# Patient Record
Sex: Male | Born: 1985 | Race: White | Hispanic: No | Marital: Single | Smoking: Former smoker
Health system: Southern US, Community
[De-identification: ages and names within clinical notes are randomized; demographics above are authoritative.]

---

## 2009-04-20 ENCOUNTER — Emergency Department (HOSPITAL_COMMUNITY): Admission: EM | Admit: 2009-04-20 | Discharge: 2009-04-21 | Payer: Self-pay | Admitting: Emergency Medicine

## 2009-05-07 ENCOUNTER — Emergency Department (HOSPITAL_COMMUNITY): Admission: EM | Admit: 2009-05-07 | Discharge: 2009-05-07 | Payer: Self-pay | Admitting: Emergency Medicine

## 2010-05-16 LAB — URINALYSIS, ROUTINE W REFLEX MICROSCOPIC
Bilirubin Urine: NEGATIVE
Glucose, UA: NEGATIVE mg/dL
Hgb urine dipstick: NEGATIVE
Protein, ur: NEGATIVE mg/dL
Specific Gravity, Urine: 1.004 — ABNORMAL LOW (ref 1.005–1.030)

## 2010-05-16 LAB — CBC
MCHC: 34.6 g/dL (ref 30.0–36.0)
Platelets: 260 10*3/uL (ref 150–400)
RBC: 4.87 MIL/uL (ref 4.22–5.81)
RDW: 12.2 % (ref 11.5–15.5)
RDW: 12.2 % (ref 11.5–15.5)
WBC: 14.9 10*3/uL — ABNORMAL HIGH (ref 4.0–10.5)

## 2010-05-16 LAB — COMPREHENSIVE METABOLIC PANEL
ALT: 32 U/L (ref 0–53)
AST: 23 U/L (ref 0–37)
Albumin: 4.3 g/dL (ref 3.5–5.2)
Alkaline Phosphatase: 71 U/L (ref 39–117)
Calcium: 9.2 mg/dL (ref 8.4–10.5)
Chloride: 102 mEq/L (ref 96–112)
Creatinine, Ser: 0.99 mg/dL (ref 0.4–1.5)
Glucose, Bld: 104 mg/dL — ABNORMAL HIGH (ref 70–99)
Total Bilirubin: 0.6 mg/dL (ref 0.3–1.2)
Total Protein: 8.1 g/dL (ref 6.0–8.3)

## 2010-05-16 LAB — LIPASE, BLOOD: Lipase: 25 U/L (ref 11–59)

## 2010-05-16 LAB — DIFFERENTIAL
Basophils Absolute: 0 10*3/uL (ref 0.0–0.1)
Basophils Absolute: 0 10*3/uL (ref 0.0–0.1)
Basophils Relative: 0 % (ref 0–1)
Eosinophils Absolute: 0 10*3/uL (ref 0.0–0.7)
Eosinophils Relative: 0 % (ref 0–5)
Lymphocytes Relative: 21 % (ref 12–46)
Lymphs Abs: 2.1 10*3/uL (ref 0.7–4.0)
Lymphs Abs: 2.4 10*3/uL (ref 0.7–4.0)
Monocytes Absolute: 0.6 10*3/uL (ref 0.1–1.0)
Monocytes Relative: 5 % (ref 3–12)
Monocytes Relative: 8 % (ref 3–12)
Neutro Abs: 11.3 10*3/uL — ABNORMAL HIGH (ref 1.7–7.7)
Neutrophils Relative %: 74 % (ref 43–77)

## 2010-05-16 LAB — POCT I-STAT, CHEM 8
Calcium, Ion: 1.11 mmol/L — ABNORMAL LOW (ref 1.12–1.32)
Glucose, Bld: 111 mg/dL — ABNORMAL HIGH (ref 70–99)
HCT: 46 % (ref 39.0–52.0)
Hemoglobin: 15.6 g/dL (ref 13.0–17.0)
Potassium: 3.8 mEq/L (ref 3.5–5.1)
TCO2: 25 mmol/L (ref 0–100)

## 2010-05-16 LAB — HEMOCCULT GUIAC POC 1CARD (OFFICE): Fecal Occult Bld: NEGATIVE

## 2010-09-20 IMAGING — CR DG ABDOMEN 2V
4 series · 4 of 4 positions shown · non-contrast
Comparison: None.

CLINICAL DATA: Abdominal pain and constipation.

ABDOMEN - 2 VIEW

[w abdomen upright *]
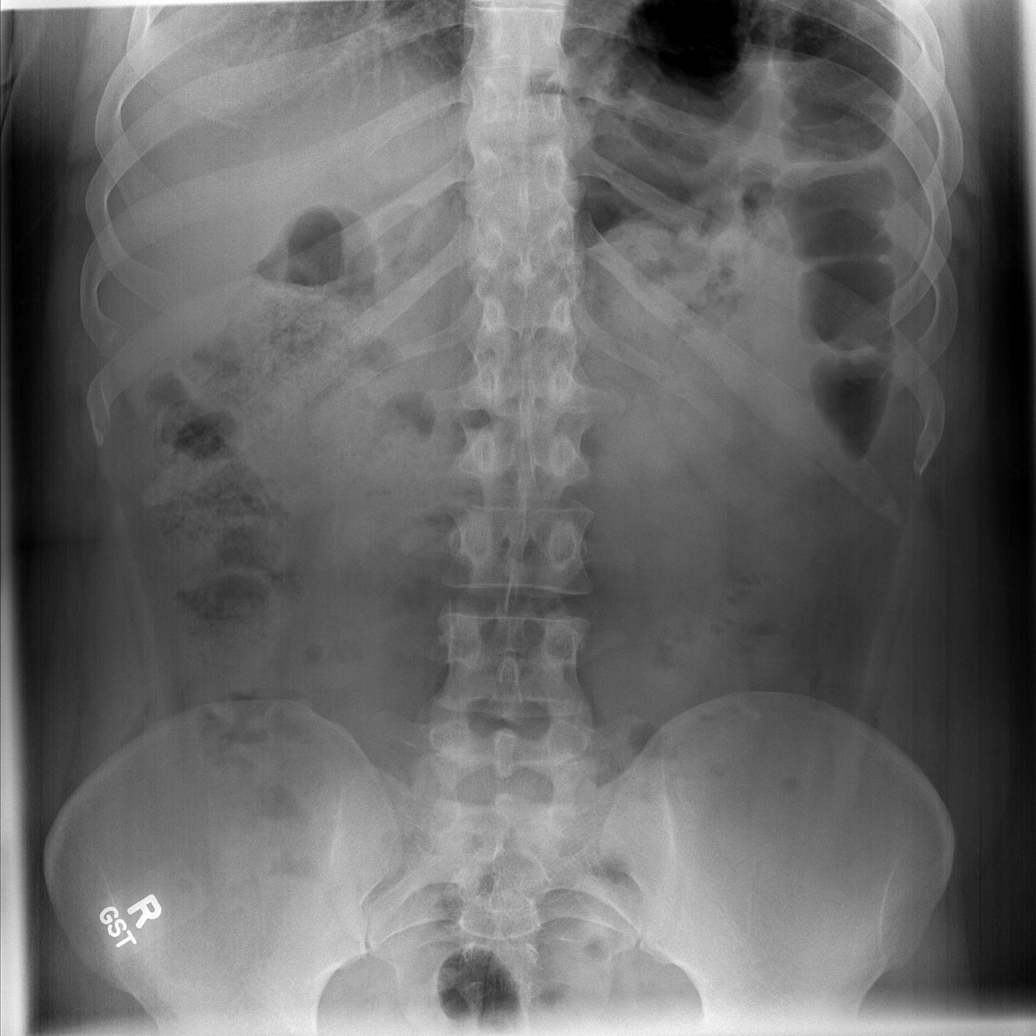

[w abdomen upright]
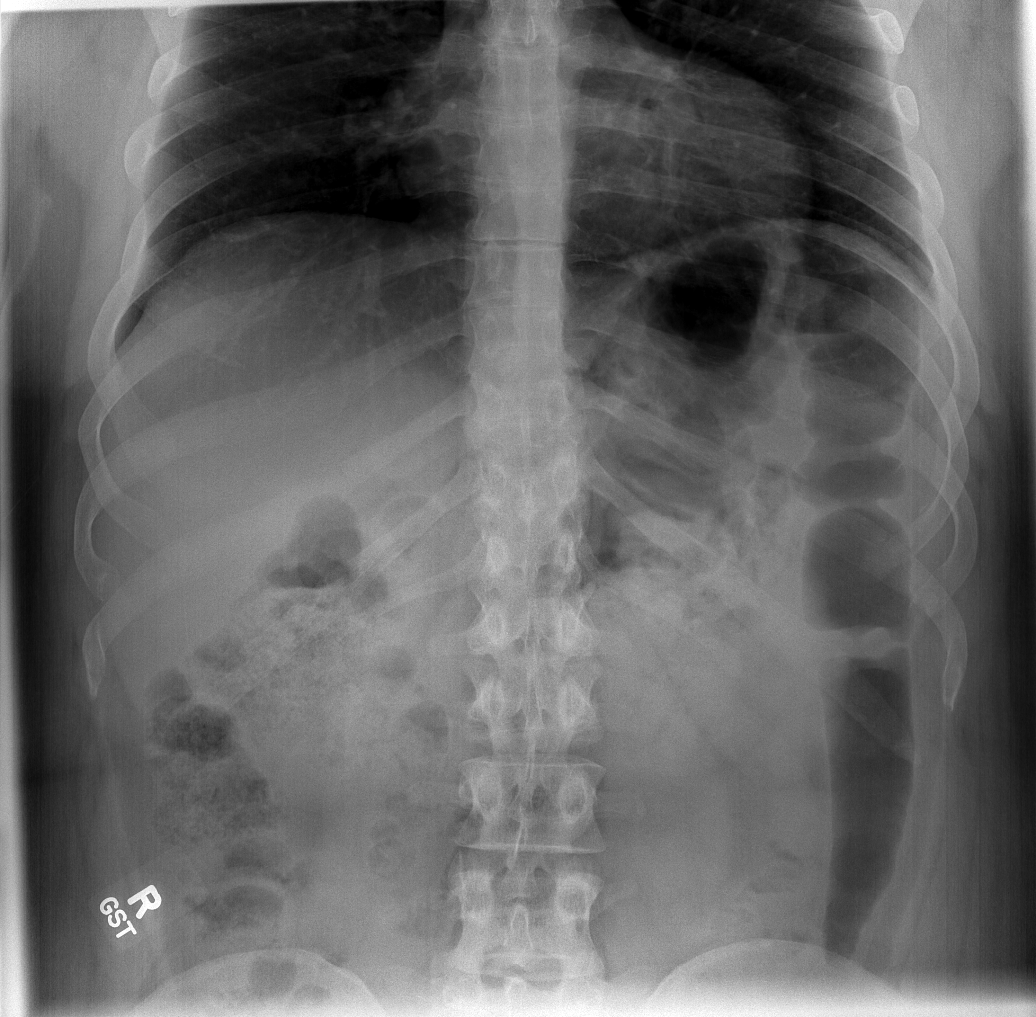

[t abdomen supine (1 of 2)]
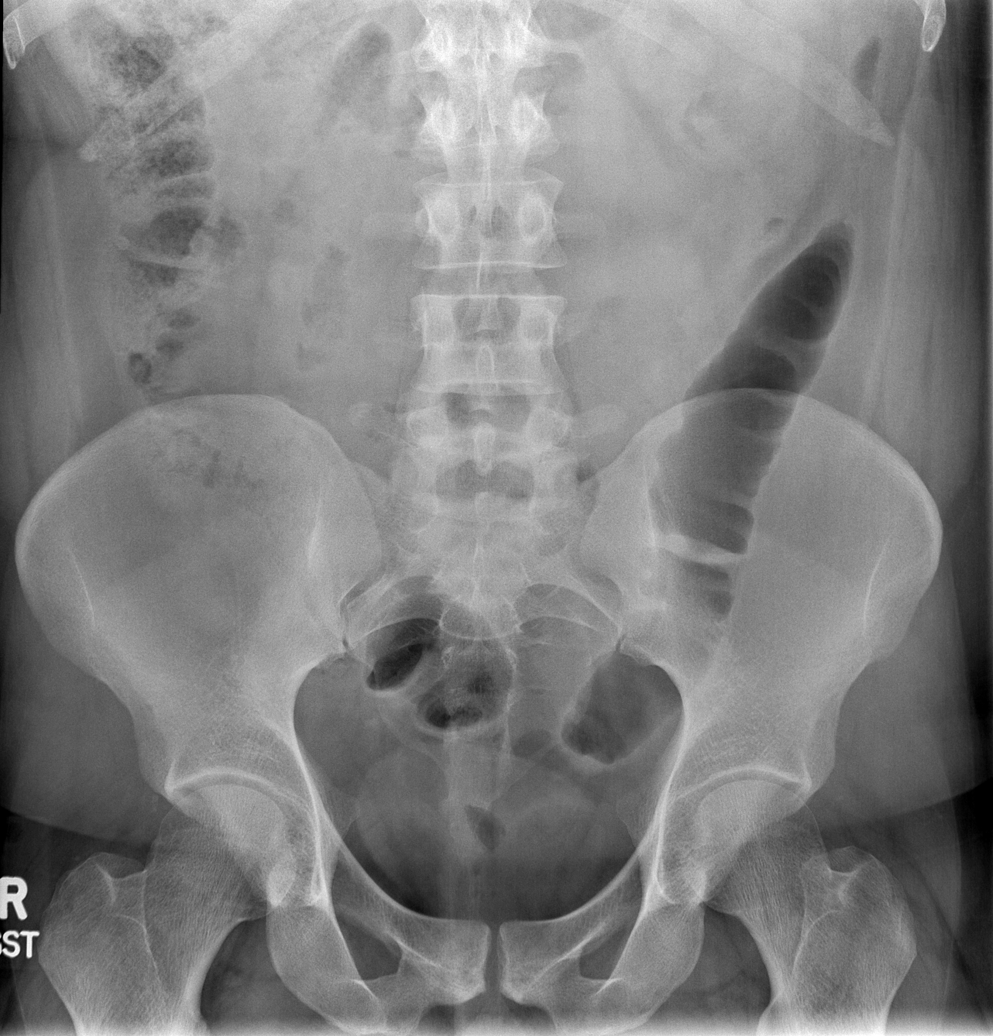

[t abdomen supine (2 of 2)]
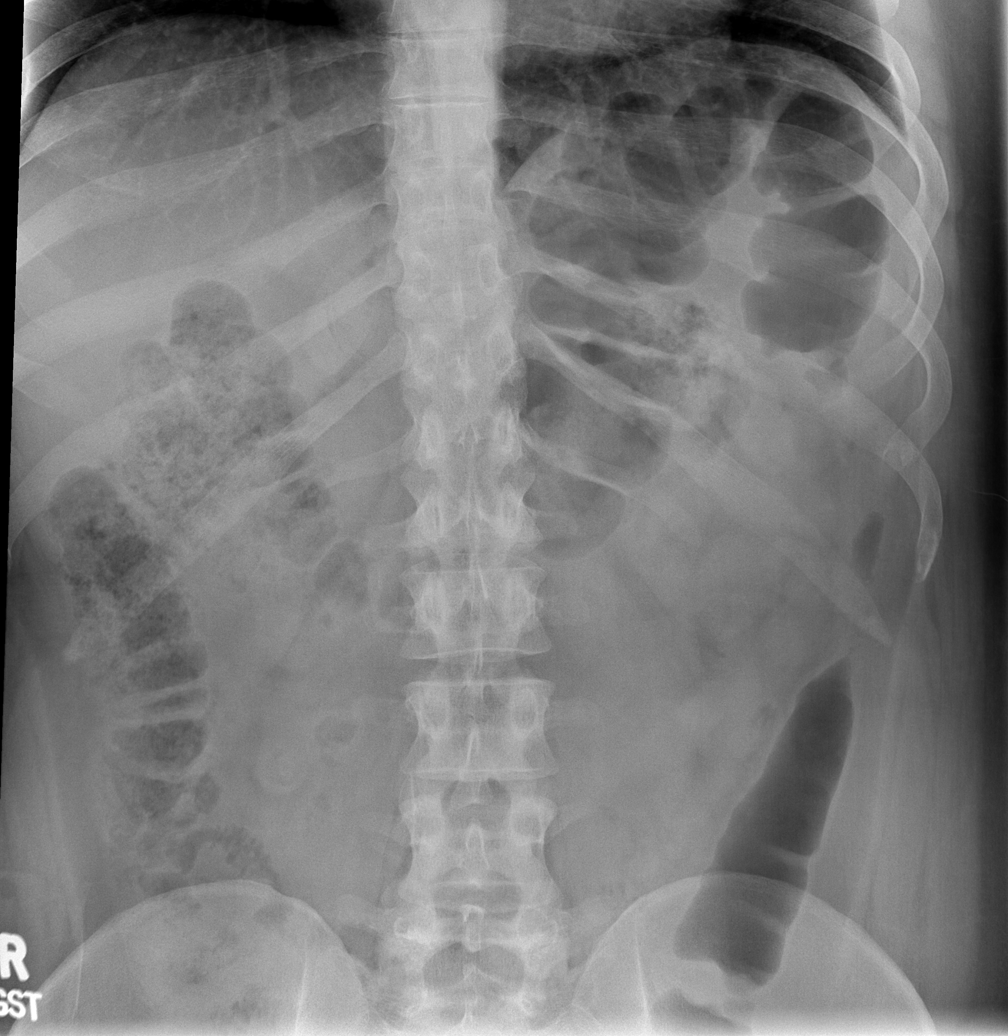

[4 of 4 positions shown; findings below may reference images not displayed]

FINDINGS: Moderate stool burden is seen in the ascending and
transverse colon.  There is no free intraperitoneal air or evidence
of small bowel obstruction.  No unexpected calcification.
IMPRESSION: Moderate stool burden ascending and transverse colon.

## 2015-04-20 ENCOUNTER — Emergency Department
Admission: EM | Admit: 2015-04-20 | Discharge: 2015-04-20 | Disposition: A | Discharge status: Home / Self Care | Payer: BLUE CROSS/BLUE SHIELD | Source: Home / Self Care | Attending: Family Medicine | Admitting: Family Medicine

## 2015-04-20 ENCOUNTER — Encounter: Payer: Self-pay | Admitting: *Deleted

## 2015-04-20 DIAGNOSIS — J069 Acute upper respiratory infection, unspecified: Secondary | ICD-10-CM | POA: Diagnosis not present

## 2015-04-20 DIAGNOSIS — K0889 Other specified disorders of teeth and supporting structures: Secondary | ICD-10-CM

## 2015-04-20 DIAGNOSIS — B9789 Other viral agents as the cause of diseases classified elsewhere: Principal | ICD-10-CM

## 2015-04-20 MED ORDER — AMOXICILLIN 875 MG PO TABS: 875.0000 mg | ORAL_TABLET | Freq: Two times a day (BID) | ORAL | Status: DC

## 2015-04-20 MED ORDER — GUAIFENESIN-CODEINE 100-10 MG/5ML PO SOLN: ORAL | Status: AC

## 2015-04-20 NOTE — Discharge Instructions (Signed)
Take plain guaifenesin (  extended release tabs such as Mucinex) twice daily, with plenty of water, for cough and congestion.  May add Pseudoephedrine ( , one or two every 4 to 6 hours) for sinus congestion.  Get adequate rest.   May use Afrin nasal spray (or generic oxymetazoline) twice daily for about 5 days and then discontinue.  Also recommend using saline nasal spray several times daily and saline nasal irrigation (AYR is a common brand).  Use Flonase nasal spray each morning after using Afrin nasal spray and saline nasal irrigation. Try warm salt water gargles for sore throat.  Stop all antihistamines for now, and other non-prescription cough/cold preparations. May take Ibuprofen , 4 tabs every 8 hours with food for toothache, headache, etc.   Follow-up with family doctor if not improving about 7 to 10 days.

## 2015-04-20 NOTE — ED Provider Notes (Signed)
CSN: [id number]     Arrival date & time 04/20/15  1511 History   First MD Initiated Contact with Patient 04/20/15 1550     Chief Complaint  Patient presents with  . Cough      HPI Comments: Patient complains of four day history of typical cold-like symptoms developing over several days,  including mild sore throat, sinus congestion, fatigue, and cough.  He also complains of two day history of sore tooth in his left mandible where a filling fell out.  The history is provided by the patient.    History reviewed. No pertinent past medical history. History reviewed. No pertinent past surgical history. Family History  Problem Relation Age of Onset  . Heart disease Mother   . Heart disease Father    Social History  Substance Use Topics  . Smoking status: Current Every Day Smoker  . Smokeless tobacco: Never Used  . Alcohol Use: Yes    Review of Systems + sore throat + cough + toothache No pleuritic pain No wheezing + nasal congestion + post-nasal drainage No sinus pain/pressure No itchy/red eyes No earache No hemoptysis No SOB No fever/chills No nausea No vomiting No abdominal pain No diarrhea No urinary symptoms No skin rash + fatigue No myalgias No headache Used OTC meds without relief  Allergies  Review of patient's allergies indicates no known allergies.  Home Medications   Prior to Admission medications   Medication Sig Start Date End Date Taking? Authorizing Provider  amoxicillin (AMOXIL) 875 MG tablet Take 1 tablet (875 mg total) by mouth 2 (two) times daily. 04/20/15   Lattie Haw, MD  guaiFENesin-codeine 100-10 MG/5ML syrup Take 10mL by mouth at bedtime as needed for cough 04/20/15   Lattie Haw, MD   Meds Ordered and Administered this Visit  Medications - No data to display  BP 149/97 mmHg  Pulse 87  Temp(Src) 98.4 F (36.9 C) (Oral)  Resp 14  Ht  (1.803 m)  Wt 272 lb (123.378 kg)  BMI 37.95 kg/m2  SpO2 99% No data  found.   Physical Exam  HENT:  Mouth/Throat: Abnormal dentition. Dental caries present. No dental abscesses.    Indicated tooth in left mandible has large central cavity and tenderness to tap.  Surrounding gingiva tender to palpation by not fluctuant.  Nursing notes and Vital Signs reviewed. Appearance:  Patient appears stated age, and in no acute distress Eyes:  Pupils are equal, round, and reactive to light and accomodation.  Extraocular movement is intact.  Conjunctivae are not inflamed  Ears:  Canals normal.  Tympanic membranes normal.  Nose:  Mildly congested turbinates.  No sinus tenderness.    Mouth:  Tenderness left mandibular tooth as noted on diagram. Pharynx:  Normal Neck:  Supple.  Tender enlarged posterior nodes are palpated bilaterally  Lungs:  Clear to auscultation.  Breath sounds are equal.  Moving air well. Heart:  Regular rate and rhythm without murmurs, rubs, or gallops.  Abdomen:  Nontender without masses or hepatosplenomegaly.  Bowel sounds are present.  No CVA or flank tenderness.  Extremities:  No edema.  Skin:  No rash present.    ED Course  Procedures  none  MDM   1. Viral URI with cough   2. Toothache    Begin amoxicillin  BID Rx for Robitussin AC for night time cough.  Take plain guaifenesin (  extended release tabs such as Mucinex) twice daily, with plenty of water, for cough and congestion.  May  add Pseudoephedrine ( , one or two every 4 to 6 hours) for sinus congestion.  Get adequate rest.   May use Afrin nasal spray (or generic oxymetazoline) twice daily for about 5 days and then discontinue.  Also recommend using saline nasal spray several times daily and saline nasal irrigation (AYR is a common brand).  Use Flonase nasal spray each morning after using Afrin nasal spray and saline nasal irrigation. Try warm salt water gargles for sore throat.  Stop all antihistamines for now, and other non-prescription cough/cold preparations. May take  Ibuprofen , 4 tabs every 8 hours with food for toothache, headache, etc. Followup with dentist as soon as possible.  Follow-up with family doctor if not improving about 7 to 10 days.    Lattie Haw, MD 04/20/15 [phone]

## 2015-04-20 NOTE — ED Notes (Signed)
Pt c/o 1-2 days of cough, productive this AM. Possible infected tooth on left lower side that he has not had evaluated yet. Afebrile. No otc meds taken.

## 2019-03-03 ENCOUNTER — Emergency Department (INDEPENDENT_AMBULATORY_CARE_PROVIDER_SITE_OTHER)
Admission: EM | Admit: 2019-03-03 | Discharge: 2019-03-03 | Disposition: A | Discharge status: Home / Self Care | Payer: Self-pay | Source: Home / Self Care

## 2019-03-03 ENCOUNTER — Other Ambulatory Visit: Payer: Self-pay

## 2019-03-03 DIAGNOSIS — J0140 Acute pansinusitis, unspecified: Secondary | ICD-10-CM

## 2019-03-03 DIAGNOSIS — K047 Periapical abscess without sinus: Secondary | ICD-10-CM

## 2019-03-03 DIAGNOSIS — H65193 Other acute nonsuppurative otitis media, bilateral: Secondary | ICD-10-CM

## 2019-03-03 MED ORDER — AMOXICILLIN-POT CLAVULANATE 875-125 MG PO TABS: 1.0000 | ORAL_TABLET | Freq: Two times a day (BID) | ORAL | 0 refills | Status: AC

## 2019-03-03 MED ORDER — BENZONATATE 100 MG PO CAPS: 100.0000 mg | ORAL_CAPSULE | Freq: Three times a day (TID) | ORAL | 0 refills | Status: AC | PRN

## 2019-03-03 MED ORDER — FLUTICASONE PROPIONATE 50 MCG/ACT NA SUSP: 1.0000 | Freq: Two times a day (BID) | NASAL | 12 refills | Status: AC

## 2019-03-03 MED ORDER — IBUPROFEN 800 MG PO TABS: 800.0000 mg | ORAL_TABLET | Freq: Two times a day (BID) | ORAL | 0 refills | Status: AC | PRN

## 2019-03-03 NOTE — ED Provider Notes (Signed)
[name]    CSN: [id number] Arrival date & time: 03/03/19  0910      History   Chief Complaint Chief Complaint  Patient presents with  . Cough  . Nasal Congestion  . Jaw Pain    HPI Sean Glenn is a 34 y.o. male.   HPI  SINUSITIS Onset: 2 weeks ago. Facial/sinus pressure with cough and congestion.  Currently has HA and lower jar pain bilaterally and mild cough. No known COVID-19 exposure. Denies fever. Endorses bilateral jaw pain.  Severity: moderate Tried OTC meds without significant relief.  Prior to Admission medications   Medication Sig Start Date End Date Taking? Authorizing Provider  amoxicillin (AMOXIL) 875 MG tablet Take 1 tablet (875 mg total) by mouth 2 (two) times daily. 04/20/15   Kandra Nicolas, MD  guaiFENesin-codeine 100-10 MG/5ML syrup Take 18mL by mouth at bedtime as needed for cough 04/20/15   Kandra Nicolas, MD    Family History Family History  Problem Relation Age of Onset  . Heart disease Mother   . Heart disease Father     Social History Social History   Tobacco Use  . Smoking status: Former Smoker    Quit date: 01/04/2019    Years since quitting: 0.1  . Smokeless tobacco: Current User    Types: Snuff  Substance Use Topics  . Alcohol use: Yes    Comment: rarely  . Drug use: Not Currently     Allergies   Patient has no known allergies. Review of Systems Review of Systems Pertinent negatives listed in HPI Physical Exam Triage Vital Signs ED Triage Vitals  Enc Vitals Group     BP 03/03/19 0928 121/71     Pulse Rate 03/03/19 0928 70     Resp 03/03/19 0928 20     Temp 03/03/19 0931 97.8 F (36.6 C)     Temp Source 03/03/19 0928 Oral     SpO2 03/03/19 0931 98 %     Weight 03/03/19 0931 (!) 320 lb (145.2 kg)     Height 03/03/19 0931 5\' 11"  (1.803 m)     Head Circumference --      Peak Flow --      Pain Score --      Pain Loc --      Pain Edu? --      Excl. in [place]? --    No data found.  Updated Vital  Signs BP 121/71   Pulse 70   Temp 97.8 F (36.6 C) (Oral)   Resp 20   Ht 5\' 11"  (1.803 m)   Wt (!) 320 lb (145.2 kg)   SpO2 98%   BMI 44.63 kg/m   Visual Acuity Right Eye Distance:   Left Eye Distance:   Bilateral Distance:    Right Eye Near:   Left Eye Near:    Bilateral Near:     Physical Exam Constitutional:      General: He is not in acute distress.    Appearance: He is not ill-appearing.  HENT:     Head: Normocephalic.     Right Ear: A middle ear effusion is present. Tympanic membrane is not erythematous or retracted.     Left Ear: A middle ear effusion is present. Tympanic membrane is not erythematous or retracted.     Mouth/Throat:     Dentition: Abnormal dentition.  Cardiovascular:     Rate and Rhythm: Normal rate and regular rhythm.  Pulmonary:     Effort:  Pulmonary effort is normal.     Breath sounds: Normal breath sounds and air entry.  Musculoskeletal:        General: Normal range of motion.  Skin:    General: Skin is warm.      UC Treatments / Results  Labs (all labs ordered are listed, but only abnormal results are displayed) Labs Reviewed - No data to display  EKG   Radiology No results found.  Procedures Procedures (including critical care time)  Medications Ordered in UC Medications - No data to display  Initial Impression / Assessment and Plan / UC Course  I have reviewed the triage vital signs and the nursing notes.  Pertinent labs & imaging results that were available during my care of the patient were reviewed by me and considered in my medical decision making (see chart for details).   Sinusitis and dental problem infection. Treat with empiric therapy, Augmentin 1 tablet BID  X 10 days to cover sinusitis and dental infections. For management of cough, start benzonatate. Acute middle ear effusion, start Flonase. Ibuprofen prescribed for dental pain or HA related to sinusitis. Red flags discussed. Advised to follow-up with  dentist. Final Clinical Impressions(s) / UC Diagnoses   Final diagnoses:  Acute non-recurrent pansinusitis  Acute MEE (middle ear effusion), bilateral  Dental infection     Discharge Instructions     Take all medications as prescribed.  Follow-up with your dentist to schedule an appointment for broken tooth extracted.  If symptoms worsen or do not improve return for follow-up.    ED Prescriptions    Medication Sig Dispense Auth. Provider   benzonatate (TESSALON) 100 MG capsule Take 1-2 capsules (100-200 mg total) by mouth 3 (three) times daily as needed for cough. 40 capsule Bing Neighbors, FNP   amoxicillin-clavulanate (AUGMENTIN) 875-125 MG tablet Take 1 tablet by mouth 2 (two) times daily. 20 tablet Bing Neighbors, FNP   fluticasone (FLONASE) 50 MCG/ACT nasal spray Place 1 spray into both nostrils 2 (two) times daily. 16 g Bing Neighbors, FNP   ibuprofen (ADVIL) 800 MG tablet Take 1 tablet (800 mg total) by mouth 2 (two) times daily as needed. 21 tablet Bing Neighbors, FNP     PDMP not reviewed this encounter.   Bing Neighbors, FNP 03/05/19 [phone]

## 2019-03-03 NOTE — Discharge Instructions (Addendum)
Take all medications as prescribed.  Follow-up with your dentist to schedule an appointment for broken tooth extracted.  If symptoms worsen or do not improve return for follow-up.

## 2019-03-03 NOTE — ED Triage Notes (Signed)
New years day woke up with a stopped up ear.  Since then has developed a cough, nasal congestion.  Has a broken tooth that started hurting last night.  Lower jaw pain

## 2023-05-10 ENCOUNTER — Emergency Department (HOSPITAL_COMMUNITY)

## 2023-05-10 ENCOUNTER — Other Ambulatory Visit: Payer: Self-pay

## 2023-05-10 ENCOUNTER — Emergency Department (HOSPITAL_COMMUNITY)
Admission: EM | Admit: 2023-05-10 | Discharge: 2023-05-11 | Disposition: A | Discharge status: Home / Self Care | Attending: Emergency Medicine | Admitting: Emergency Medicine

## 2023-05-10 DIAGNOSIS — S0991XA Unspecified injury of ear, initial encounter: Secondary | ICD-10-CM | POA: Diagnosis present

## 2023-05-10 DIAGNOSIS — W19XXXA Unspecified fall, initial encounter: Secondary | ICD-10-CM | POA: Insufficient documentation

## 2023-05-10 DIAGNOSIS — R519 Headache, unspecified: Secondary | ICD-10-CM | POA: Diagnosis not present

## 2023-05-10 DIAGNOSIS — S01312A Laceration without foreign body of left ear, initial encounter: Secondary | ICD-10-CM | POA: Insufficient documentation

## 2023-05-10 DIAGNOSIS — R0781 Pleurodynia: Secondary | ICD-10-CM | POA: Insufficient documentation

## 2023-05-10 DIAGNOSIS — S01302A Unspecified open wound of left ear, initial encounter: Secondary | ICD-10-CM

## 2023-05-10 LAB — BASIC METABOLIC PANEL
Anion gap: 10 (ref 5–15)
BUN: 6 mg/dL (ref 6–20)
CO2: 23 mmol/L (ref 22–32)
Calcium: 8.7 mg/dL — ABNORMAL LOW (ref 8.9–10.3)
Chloride: 100 mmol/L (ref 98–111)
Creatinine, Ser: 1.07 mg/dL (ref 0.61–1.24)
GFR, Estimated: >60 mL/min (ref >60)
Glucose, Bld: 107 mg/dL — ABNORMAL HIGH (ref 70–99)
Potassium: 3.6 mmol/L (ref 3.5–5.1)
Sodium: 133 mmol/L — ABNORMAL LOW (ref 135–145)

## 2023-05-10 LAB — CBC WITH DIFFERENTIAL/PLATELET
Abs Immature Granulocytes: 0.06 10*3/uL (ref 0.00–0.07)
Basophils Absolute: 0 10*3/uL (ref 0.0–0.1)
Basophils Relative: 0 %
Eosinophils Absolute: 0.1 10*3/uL (ref 0.0–0.5)
Eosinophils Relative: 1 %
HCT: 43.3 % (ref 39.0–52.0)
Hemoglobin: 14.1 g/dL (ref 13.0–17.0)
Immature Granulocytes: 1 %
Lymphocytes Relative: 11 %
Lymphs Abs: 1 10*3/uL (ref 0.7–4.0)
MCH: 30 pg (ref 26.0–34.0)
MCHC: 32.6 g/dL (ref 30.0–36.0)
MCV: 92.1 fL (ref 80.0–100.0)
Monocytes Absolute: 0.7 10*3/uL (ref 0.1–1.0)
Monocytes Relative: 8 %
Neutro Abs: 7 10*3/uL (ref 1.7–7.7)
Neutrophils Relative %: 79 %
Platelets: 229 10*3/uL (ref 150–400)
RBC: 4.7 MIL/uL (ref 4.22–5.81)
RDW: 12.2 % (ref 11.5–15.5)
WBC: 8.8 10*3/uL (ref 4.0–10.5)
nRBC: 0 % (ref 0.0–0.2)

## 2023-05-10 LAB — RESP PANEL BY RT-PCR (RSV, FLU A&B, COVID)  RVPGX2
Influenza A by PCR: NEGATIVE
Influenza B by PCR: NEGATIVE
Resp Syncytial Virus by PCR: NEGATIVE
SARS Coronavirus 2 by RT PCR: NEGATIVE

## 2023-05-10 MED ORDER — IBUPROFEN 400 MG PO TABS
400.0000 mg | ORAL_TABLET | Freq: Once | ORAL | Status: AC | PRN
  Administered 2023-05-10: 400 mg via ORAL
  Filled 2023-05-10: qty 1

## 2023-05-10 MED ORDER — OXYCODONE-ACETAMINOPHEN 5-325 MG PO TABS
1.0000 | ORAL_TABLET | Freq: Once | ORAL | Status: AC
  Administered 2023-05-10: 1 via ORAL
  Filled 2023-05-10: qty 1

## 2023-05-10 NOTE — ED Triage Notes (Signed)
 Pt came in via POV d/t 3 days ago [place] & states he fell hitting the Rt side of his head on the tracks, did have LOC, & also c/o Rt sided rib pain that makes it difficult to take deep breaths &/or cough. A/Ox4, rates his pain 10/10.

## 2023-05-10 NOTE — ED Provider Triage Note (Signed)
 Emergency Medicine Provider Triage Evaluation Note  Sean Glenn , a 38 y.o. male  was evaluated in triage.  Pt complains of fall. [place] 3 days ago.  Did hit head and report LOC.  Pain to R ribs and headache.  Has had congestion and coughs as well. No numbness or weakness, no n/v/d  Review of Systems  Positive: As above Negative: As above  Physical Exam  BP 117/73 (BP Location: Right Arm)   Pulse 94   Temp (!) 100.6 F (38.1 C) (Oral)   Resp 19   Ht 5\' 11"  (1.803 m)   Wt 113.4 kg   SpO2 99%   BMI 34.87 kg/m  Gen:   Awake, no distress   Resp:  Normal effort  MSK:   Moves extremities without difficulty  Other:    Medical Decision Making  Medically screening exam initiated at 3:06 PM.  Appropriate orders placed.  JAYLUN FLEENER was informed that the remainder of the evaluation will be completed by another provider, this initial triage assessment does not replace that evaluation, and the importance of remaining in the ED until their evaluation is complete.     Fayrene Helper, PA-C 05/10/23 [phone]

## 2023-05-10 NOTE — ED Notes (Signed)
 Pt remain in the lobby waiting room availability. Is A&Ox4, GCS 15. No neuro s/s. Vs updated.

## 2023-05-10 NOTE — ED Notes (Signed)
 Patient transported to X-ray

## 2023-05-11 DIAGNOSIS — S01312A Laceration without foreign body of left ear, initial encounter: Secondary | ICD-10-CM | POA: Diagnosis not present

## 2023-05-11 MED ORDER — LIDOCAINE 5 % EX PTCH: 1.0000 | MEDICATED_PATCH | CUTANEOUS | 0 refills | Status: AC

## 2023-05-11 MED ORDER — LIDOCAINE 5 % EX PTCH
1.0000 | MEDICATED_PATCH | CUTANEOUS | Status: DC
  Administered 2023-05-11: 1 via TRANSDERMAL
  Filled 2023-05-11: qty 1

## 2023-05-11 MED ORDER — DOXYCYCLINE HYCLATE 100 MG PO TABS
100.0000 mg | ORAL_TABLET | Freq: Once | ORAL | Status: AC
  Administered 2023-05-11: 100 mg via ORAL
  Filled 2023-05-11: qty 1

## 2023-05-11 MED ORDER — BACITRACIN ZINC 500 UNIT/GM EX OINT
TOPICAL_OINTMENT | Freq: Two times a day (BID) | CUTANEOUS | Status: DC
  Administered 2023-05-11: 1 via TOPICAL
  Filled 2023-05-11: qty 0.9

## 2023-05-11 MED ORDER — DOXYCYCLINE HYCLATE 100 MG PO CAPS: 100.0000 mg | ORAL_CAPSULE | Freq: Two times a day (BID) | ORAL | 0 refills | Status: AC

## 2023-05-11 NOTE — Discharge Instructions (Addendum)
 Please follow-up with your primary care provider in regards recent ER visit.  Today your imaging was negative you most likely have bruised ribs.  May take Tylenol or Profen every 6 hours any for pain.  You also appear to have a wound to your left ear.  I prescribed antibiotics.  Please keep the wound clean and dry use antibiotic ointment.  If symptoms change or worsen return to the ER.

## 2023-05-11 NOTE — ED Provider Notes (Signed)
 [place] [facility] AT [facility] Provider Note   CSN: [id number] Arrival date & time: 05/10/23  1314     History  Chief Complaint  Patient presents with   Head Injury   Rt sided Rib pain     Sean Glenn is a 38 y.o. male with no pertinent past medical presented after a fall occurred 3 days ago.  Patient states he was walking on the train tracks mechanical fall and hit the right side of his head and right ribs.  Patient has chest pain or shortness of breath but states that his right ribs hurt.  Patient denies abdominal pain nausea vomiting.  Patient denies any blood thinners but states that he did lose consciousness for about 30 minutes.  Patient may walk since then.  Patient not take any medications.  Patient states he has a cut on his left ear but otherwise has no other acute concerns.  Patient denies back pain, paresthesias.  Home Medications Prior to Admission medications   Medication Sig Start Date End Date Taking? Authorizing Provider  doxycycline (VIBRAMYCIN) 100 MG capsule Take 1 capsule (100 mg total) by mouth 2 (two) times daily for 7 days. 05/11/23 05/18/23 Yes Dishawn Bhargava, Beverly Gust, PA-C  lidocaine (LIDODERM) 5 % Place 1 patch onto the skin daily. Remove & Discard patch within 12 hours or as directed by MD 05/11/23  Yes Netta Corrigan, PA-C  amoxicillin-clavulanate (AUGMENTIN) 875-125 MG tablet Take 1 tablet by mouth 2 (two) times daily. 03/03/19   Bing Neighbors, NP  benzonatate (TESSALON) 100 MG capsule Take 1-2 capsules (100-200 mg total) by mouth 3 (three) times daily as needed for cough. 03/03/19   Bing Neighbors, NP  fluticasone (FLONASE) 50 MCG/ACT nasal spray Place 1 spray into both nostrils 2 (two) times daily. 03/03/19   Bing Neighbors, NP  guaiFENesin-codeine 100-10 MG/5ML syrup Take 10mL by mouth at bedtime as needed for cough 04/20/15   Lattie Haw, MD  ibuprofen (ADVIL) 800 MG tablet Take 1 tablet (800 mg total) by mouth 2 (two)  times daily as needed. 03/03/19   Bing Neighbors, NP      Allergies    Patient has no known allergies.    Review of Systems   Review of Systems  Physical Exam Updated Vital Signs BP (!) 109/51 (BP Location: Right Arm)   Pulse 69   Temp 97.9 F (36.6 C)   Resp 20   Ht 5\' 11"  (1.803 m)   Wt 113.4 kg   SpO2 97%   BMI 34.87 kg/m  Physical Exam Vitals reviewed.  Constitutional:      General: He is not in acute distress. HENT:     Head: Normocephalic and atraumatic.     Right Ear: Tympanic membrane, ear canal and external ear normal.     Left Ear: Tympanic membrane and ear canal normal.     Ears:     Comments: Left ear: Tiny laceration noted to external portion of left ear with some surrounding erythema and purulent material    Nose: Nose normal.  Eyes:     Extraocular Movements: Extraocular movements intact.     Conjunctiva/sclera: Conjunctivae normal.     Pupils: Pupils are equal, round, and reactive to light.  Cardiovascular:     Rate and Rhythm: Normal rate and regular rhythm.     Pulses: Normal pulses.     Heart sounds: Normal heart sounds.     Comments: 2+ bilateral radial/dorsalis  pedis pulses with regular rate Pulmonary:     Effort: Pulmonary effort is normal. No respiratory distress.     Breath sounds: Normal breath sounds.  Abdominal:     Palpations: Abdomen is soft.     Tenderness: There is no abdominal tenderness. There is no guarding or rebound.  Musculoskeletal:        General: Normal range of motion.     Cervical back: Normal range of motion and neck supple. No tenderness.     Comments: 5 out of 5 bilateral grip/leg extension strength Right rib tenderness without bony abnormalities  Skin:    General: Skin is warm and dry.     Capillary Refill: Capillary refill takes less than 2 seconds.     Comments: No overlying skin color changes  Neurological:     General: No focal deficit present.     Mental Status: He is alert and oriented to person, place,  and time.     Comments: Sensation intact in all 4 limbs  Psychiatric:        Mood and Affect: Mood normal.     ED Results / Procedures / Treatments   Labs (all labs ordered are listed, but only abnormal results are displayed) Labs Reviewed  BASIC METABOLIC PANEL - Abnormal; Notable for the following components:      Result Value   Sodium 133 (*)    Glucose, Bld 107 (*)    Calcium 8.7 (*)    All other components within normal limits  RESP PANEL BY RT-PCR (RSV, FLU A&B, COVID)  RVPGX2  CBC WITH DIFFERENTIAL/PLATELET    EKG None  Radiology CT Head Wo Contrast Result Date: 05/10/2023 CLINICAL DATA:  Facial trauma, blunt Fall hitting right side of head. EXAM: CT HEAD WITHOUT CONTRAST TECHNIQUE: Contiguous axial images were obtained from the base of the skull through the vertex without intravenous contrast. RADIATION DOSE REDUCTION: This exam was performed according to the departmental dose-optimization program which includes automated exposure control, adjustment of the mA and/or kV according to patient size and/or use of iterative reconstruction technique. COMPARISON:  None Available. FINDINGS: Brain: No intracranial hemorrhage, mass effect, or midline shift. No hydrocephalus. The basilar cisterns are patent. No evidence of territorial infarct or acute ischemia. No extra-axial or intracranial fluid collection. Vascular: No hyperdense vessel or unexpected calcification. Skull: No fracture or focal lesion. Sinuses/Orbits: No acute fracture. Lobulated mucosal thickening throughout the right maxillary sinus. Scattered opacification of ethmoid air cells. No mastoid effusion. Other: No confluent scalp hematoma. IMPRESSION: 1. No acute intracranial abnormality. No skull fracture. 2. Right maxillary and ethmoid sinus disease. Electronically Signed   By: Narda Rutherford M.D.   On: 05/10/2023 16:22   DG Ribs Unilateral W/Chest Right Result Date: 05/10/2023 CLINICAL DATA:  Status post fall with right  rib pain EXAM: RIGHT RIBS AND CHEST - 3 VIEW COMPARISON:  None Available. FINDINGS: No acute displaced fracture or other bone lesions are seen involving the ribs. There is no evidence of pneumothorax or pleural effusion. Patchy right basilar opacity. Heart size and mediastinal contours are within normal limits. Right upper quadrant surgical clips. IMPRESSION: 1. No acute displaced rib fracture. 2. Patchy right basilar opacity, likely atelectasis. Electronically Signed   By: Agustin Cree M.D.   On: 05/10/2023 16:11    Procedures Procedures    Medications Ordered in ED Medications  lidocaine (LIDODERM) 5 % 1 patch (1 patch Transdermal Patch Applied 05/11/23 0032)  bacitracin ointment (1 Application Topical Given 05/11/23 0031)  oxyCODONE-acetaminophen (PERCOCET/ROXICET) 5-325 MG per tablet 1 tablet (1 tablet Oral Given 05/10/23 1516)  ibuprofen (ADVIL) tablet 400 mg (400 mg Oral Given 05/10/23 2020)  doxycycline (VIBRA-TABS) tablet 100 mg (100 mg Oral Given 05/11/23 0031)    ED Course/ Medical Decision Making/ A&P                                 Medical Decision Making Risk OTC drugs. Prescription drug management.   Darreld Mclean 38 y.o. presented today for fall. Working DDx that I considered at this time includes, but not limited to, vasovagal episode, mechanical fall, ICH, epidural/subdural hematoma, basilar skull fracture, anemia, electrolyte abnormalities, drug-induced, arrhythmia, UTI, fracture, contusion, soft tissue injury.  R/o DDx: vasovagal episode, ICH, epidural/subdural hematoma, basilar skull fracture, anemia, electrolyte abnormalities, drug-induced, arrhythmia, UTI, fracture: These are considered less likely due to history of present illness, physical exam, lab/imaging findings  Review of prior external notes: 12/07/2022 outpatient visit  Unique Tests and My Independent Interpretation:  CT Head without contrast: Unremarkable EKG: Sinus 91 bpm, no ST elevations or depressions  noted CBC: Unremarkable BMP: Unremarkable Respiratory panel: Negative  Social Determinants of Health: none  Discussion with Independent Historian: None  Discussion of Management of Tests: None  Risk: Medium: prescription drug management  Risk Stratification Score: None  Plan: On exam patient was in no acute distress with stable vitals.  Patient does have a laceration noted to his left ear and tenderness to right ribs without bony abnormalities but otherwise no acute pathology was found.  Imaging from triage was reassuring.  Do suspect patient has rib contusion.  Will give lidocaine patch for patient's ribs and encouraged OTCs every 6 hours needed for pain along with ice and to follow-up.  In terms of patient's ear wound.  There does appear to be cellulitis along with some purulent material as has been sitting for 3 days.  Will irrigate wound and place antibiotic ointment and give doxycycline and prescribe this.  Encourage patient to follow-up for wound check in 48 hours.  Patient's neuroexam was reassuring he has no neurodeficits and at this time is safe to be discharged.  Patient was given return precautions. Patient stable for discharge at this time.  Patient verbalized understanding of plan.  This chart was dictated using voice recognition software.  Despite best efforts to proofread,  errors can occur which can change the documentation meaning.         Final Clinical Impression(s) / ED Diagnoses Final diagnoses:  Fall, initial encounter  Open wound of left ear, unspecified open wound type, initial encounter    Rx / DC Orders ED Discharge Orders          Ordered    lidocaine (LIDODERM) 5 %  Every 24 hours        05/11/23 0018    doxycycline (VIBRAMYCIN) 100 MG capsule  2 times daily        05/11/23 0030              Remi Deter 05/11/23 0102    Mesner, Barbara Cower, MD 05/11/23 0430
